# Patient Record
Sex: Female | Born: 1965 | Race: White | Hispanic: No | Marital: Single | Smoking: Never smoker
Health system: Southern US, Community
[De-identification: ages and names within clinical notes are randomized; demographics above are authoritative.]

## PROBLEM LIST (undated history)

## (undated) DIAGNOSIS — I10 Essential (primary) hypertension: Secondary | ICD-10-CM

## (undated) DIAGNOSIS — Q613 Polycystic kidney, unspecified: Secondary | ICD-10-CM

## (undated) HISTORY — DX: Polycystic kidney, unspecified: Q61.3

## (undated) HISTORY — PX: OTHER SURGICAL HISTORY: SHX169

## (undated) HISTORY — PX: HERNIA REPAIR: SHX51

## (undated) HISTORY — DX: Essential (primary) hypertension: I10

## (undated) HISTORY — PX: TONSILLECTOMY AND ADENOIDECTOMY: SUR1326

---

## 2019-09-14 DIAGNOSIS — I150 Renovascular hypertension: Secondary | ICD-10-CM | POA: Insufficient documentation

## 2019-09-14 DIAGNOSIS — Q612 Polycystic kidney, adult type: Secondary | ICD-10-CM | POA: Insufficient documentation

## 2020-01-27 DIAGNOSIS — I151 Hypertension secondary to other renal disorders: Secondary | ICD-10-CM | POA: Diagnosis not present

## 2020-01-27 DIAGNOSIS — Q612 Polycystic kidney, adult type: Secondary | ICD-10-CM | POA: Diagnosis not present

## 2020-02-22 DIAGNOSIS — N1832 Chronic kidney disease, stage 3b: Secondary | ICD-10-CM | POA: Diagnosis not present

## 2020-02-22 DIAGNOSIS — R82998 Other abnormal findings in urine: Secondary | ICD-10-CM | POA: Diagnosis not present

## 2020-02-22 DIAGNOSIS — E785 Hyperlipidemia, unspecified: Secondary | ICD-10-CM | POA: Diagnosis not present

## 2020-08-03 DIAGNOSIS — E785 Hyperlipidemia, unspecified: Secondary | ICD-10-CM | POA: Diagnosis not present

## 2020-08-03 DIAGNOSIS — I151 Hypertension secondary to other renal disorders: Secondary | ICD-10-CM | POA: Diagnosis not present

## 2020-10-19 ENCOUNTER — Other Ambulatory Visit: Payer: Self-pay

## 2020-10-19 ENCOUNTER — Other Ambulatory Visit (HOSPITAL_COMMUNITY)
Admission: RE | Admit: 2020-10-19 | Discharge: 2020-10-19 | Disposition: A | Discharge status: Home / Self Care | Payer: BC Managed Care – PPO | Source: Ambulatory Visit | Attending: Obstetrics and Gynecology | Admitting: Obstetrics and Gynecology

## 2020-10-19 ENCOUNTER — Encounter: Payer: Self-pay | Admitting: Obstetrics and Gynecology

## 2020-10-19 ENCOUNTER — Ambulatory Visit (INDEPENDENT_AMBULATORY_CARE_PROVIDER_SITE_OTHER): Payer: BC Managed Care – PPO | Admitting: Obstetrics and Gynecology

## 2020-10-19 VITALS — BP 134/72 | HR 66 | Ht 63.75 in | Wt 139.0 lb

## 2020-10-19 DIAGNOSIS — Z01419 Encounter for gynecological examination (general) (routine) without abnormal findings: Secondary | ICD-10-CM

## 2020-10-19 DIAGNOSIS — E785 Hyperlipidemia, unspecified: Secondary | ICD-10-CM | POA: Insufficient documentation

## 2020-10-19 DIAGNOSIS — N1832 Chronic kidney disease, stage 3b: Secondary | ICD-10-CM | POA: Insufficient documentation

## 2020-10-19 DIAGNOSIS — Z124 Encounter for screening for malignant neoplasm of cervix: Secondary | ICD-10-CM

## 2020-10-19 NOTE — Progress Notes (Signed)
55 y.o. [id number]. Single White or Caucasian Not Hispanic or Latino female here for annual exam and establishing care after moving from [place] last year in Aug. No vaginal bleeding. No significant vasomotor symptoms.  She has mild vaginal dryness with intercourse.   She has kidney disease. She is going to establish care with a Nephrologist here. Has an Internist here. She has lab every 3 month.     No LMP recorded. Patient is postmenopausal.          Sexually active: Yes.    The current method of family planning is post menopausal status.    Exercising: Yes.     Personal trainer  Smoker:  no  Health Maintenance: Pap:  2021 in [place] History of abnormal Pap:  years ago colpo  MMG:  before Oct 23 2019 in [place]  BMD:   none  Colonoscopy: 2017 f/u 10 years  TDaP:  2022 Gardasil: na    reports that she has never smoked. She has never used smokeless tobacco. She reports current alcohol use. She reports that she does not use drugs. 2-4 drinks a week. Moved here from [place] for work. Moved her with her long term boyfriend. Son is 33, in [place]. Daughter is 25 in [place].   Past Medical History:  Diagnosis Date   Hypertension    Polycystic kidney disease     Past Surgical History:  Procedure Laterality Date   CESAREAN SECTION     HERNIA REPAIR N/A    knee surgery     TONSILLECTOMY AND ADENOIDECTOMY    C/S then VBAC  Current Outpatient Medications  Medication Sig Dispense Refill   lisinopril (ZESTRIL) 40 MG tablet 1 tablet     rosuvastatin (CRESTOR) 5 MG tablet 1 tablet     Tolvaptan (JYNARQUE) 90 & 30 MG TBPK See admin instructions.     No current facility-administered medications for this visit.    History reviewed. No pertinent family history.  Review of Systems  Genitourinary:        Vaginal dryness    All other systems reviewed and are negative.  Exam:   BP 134/72   Pulse 66   Ht 5' 3.75" (1.619 m)   Wt 139 lb (63 kg)   SpO2 99%   BMI  24.05 kg/m   Weight change: @WEIGHTCHANGE @ Height:   Height: 5' 3.75" (161.9 cm)  Ht Readings from Last 3 Encounters:  10/19/20 5' 3.75" (1.619 m)    General appearance: alert, cooperative and appears stated age Head: Normocephalic, without obvious abnormality, atraumatic Neck: no adenopathy, supple, symmetrical, trachea midline and thyroid normal to inspection and palpation Lungs: clear to auscultation bilaterally Cardiovascular: regular rate and rhythm Breasts: normal appearance, no masses or tenderness Abdomen: soft, non-tender; non distended,  both kidneys palpated in her lower abdomen, L>R (not a change per patient) Extremities: extremities normal, atraumatic, no cyanosis or edema Skin: Skin color, texture, turgor normal. No rashes or lesions Lymph nodes: Cervical, supraclavicular, and axillary nodes normal. No abnormal inguinal nodes palpated Neurologic: Grossly normal   Pelvic: External genitalia:  no lesions              Urethra:  normal appearing urethra with no masses, tenderness or lesions              Bartholins and Skenes: normal                 Vagina: atrophic appearing vagina with normal color and discharge, no lesions  Cervix: no lesions               Bimanual Exam:  Uterus:  normal size, contour, position, consistency, mobility, non-tender              Adnexa: no mass, fullness, tenderness               Rectovaginal: Confirms               Anus:  normal sphincter tone, no lesions  Kennon Portela, CMA chaperoned for the exam.  1. Well woman exam Discussed breast self exam Discussed calcium and vit D intake Mammogram due, # given Labs with primary Colonoscopy is UTD  2. Screening for cervical cancer - Cytology - PAP

## 2020-10-19 NOTE — Patient Instructions (Addendum)
Try uberlube for lubrication  EXERCISE   We recommended that you start or continue a regular exercise program for good health. Physical activity is anything that gets your body moving, some is better than none. The CDC recommends 150 minutes per week of Moderate-Intensity Aerobic Activity and 2 or more days of Muscle Strengthening Activity.  Benefits of exercise are limitless: helps weight loss/weight maintenance, improves mood and energy, helps with depression and anxiety, improves sleep, tones and strengthens muscles, improves balance, improves bone density, protects from chronic conditions such as heart disease, high blood pressure and diabetes and so much more. To learn more visit: https://www.cdc.gov/physicalactivity/index.html  DIET: Good nutrition starts with a healthy diet of fruits, vegetables, whole grains, and lean protein sources. Drink plenty of water for hydration. Minimize empty calories, sodium, sweets. For more information about dietary recommendations visit: https://health.gov/our-work/nutrition-physical-activity/dietary-guidelines and https://www.myplate.gov/  ALCOHOL:  Women should limit their alcohol intake to no more than 7 drinks/beers/glasses of wine (combined, not each!) per week. Moderation of alcohol intake to this level decreases your risk of breast cancer and liver damage.  If you are concerned that you may have a problem, or your friends have told you they are concerned about your drinking, there are many resources to help. A well-known program that is free, effective, and available to all people all over the nation is Alcoholics Anonymous.  Check out this site to learn more: https://www.aa.org/   CALCIUM AND VITAMIN D:  Adequate intake of calcium and Vitamin D are recommended for bone health.  You should be getting between 1000-1200 mg of calcium and 800 units of Vitamin D daily between diet and supplements  PAP SMEARS:  Pap smears, to check for cervical cancer or  precancers,  have traditionally been done yearly, scientific advances have shown that most women can have pap smears less often.  However, every woman still should have a physical exam from her gynecologist every year. It will include a breast check, inspection of the vulva and vagina to check for abnormal growths or skin changes, a visual exam of the cervix, and then an exam to evaluate the size and shape of the uterus and ovaries. We will also provide age appropriate advice regarding health maintenance, like when you should have certain vaccines, screening for sexually transmitted diseases, bone density testing, colonoscopy, mammograms, etc.   MAMMOGRAMS:  All women over 40 years old should have a routine mammogram.   COLON CANCER SCREENING: Now recommend starting at age 45. At this time colonoscopy is not covered for routine screening until 50. There are take home tests that can be done between 45-49.   COLONOSCOPY:  Colonoscopy to screen for colon cancer is recommended for all women at age 50.  We know, you hate the idea of the prep.  We agree, BUT, having colon cancer and not knowing it is worse!!  Colon cancer so often starts as a polyp that can be seen and removed at colonscopy, which can quite literally save your life!  And if your first colonoscopy is normal and you have no family history of colon cancer, most women don't have to have it again for 10 years.  Once every ten years, you can do something that may end up saving your life, right?  We will be happy to help you get it scheduled when you are ready.  Be sure to check your insurance coverage so you understand how much it will cost.  It may be covered as a preventative service at no   cost, but you should check your particular policy.      Breast Self-Awareness Breast self-awareness means being familiar with how your breasts look and feel. It involves checking your breasts regularly and reporting any changes to your health care  provider. Practicing breast self-awareness is important. A change in your breasts can be a sign of a serious medical problem. Being familiar with how your breasts look and feel allows you to find any problems early, when treatment is more likely to be successful. All women should practice breast self-awareness, including women who have had breast implants. How to do a breast self-exam One way to learn what is normal for your breasts and whether your breasts are changing is to do a breast self-exam. To do a breast self-exam: Look for Changes  Remove all the clothing above your waist. Stand in front of a mirror in a room with good lighting. Put your hands on your hips. Push your hands firmly downward. Compare your breasts in the mirror. Look for differences between them (asymmetry), such as: Differences in shape. Differences in size. Puckers, dips, and bumps in one breast and not the other. Look at each breast for changes in your skin, such as: Redness. Scaly areas. Look for changes in your nipples, such as: Discharge. Bleeding. Dimpling. Redness. A change in position. Feel for Changes Carefully feel your breasts for lumps and changes. It is best to do this while lying on your back on the floor and again while sitting or standing in the shower or tub with soapy water on your skin. Feel each breast in the following way: Place the arm on the side of the breast you are examining above your head. Feel your breast with the other hand. Start in the nipple area and make  inch (2 cm) overlapping circles to feel your breast. Use the pads of your three middle fingers to do this. Apply light pressure, then medium pressure, then firm pressure. The light pressure will allow you to feel the tissue closest to the skin. The medium pressure will allow you to feel the tissue that is a little deeper. The firm pressure will allow you to feel the tissue close to the ribs. Continue the overlapping circles,  moving downward over the breast until you feel your ribs below your breast. Move one finger-width toward the center of the body. Continue to use the  inch (2 cm) overlapping circles to feel your breast as you move slowly up toward your collarbone. Continue the up and down exam using all three pressures until you reach your armpit.  Write Down What You Find  Write down what is normal for each breast and any changes that you find. Keep a written record with breast changes or normal findings for each breast. By writing this information down, you do not need to depend only on memory for size, tenderness, or location. Write down where you are in your menstrual cycle, if you are still menstruating. If you are having trouble noticing differences in your breasts, do not get discouraged. With time you will become more familiar with the variations in your breasts and more comfortable with the exam. How often should I examine my breasts? Examine your breasts every month. If you are breastfeeding, the best time to examine your breasts is after a feeding or after using a breast pump. If you menstruate, the best time to examine your breasts is 5-7 days after your period is over. During your period, your breasts are lumpier,   and it may be more difficult to notice changes. When should I see my health care provider? See your health care provider if you notice: A change in shape or size of your breasts or nipples. A change in the skin of your breast or nipples, such as a reddened or scaly area. Unusual discharge from your nipples. A lump or thick area that was not there before. Pain in your breasts. Anything that concerns you.  

## 2020-10-23 LAB — CYTOLOGY - PAP
Comment: NEGATIVE
Diagnosis: NEGATIVE
High risk HPV: NEGATIVE

## 2020-12-21 DIAGNOSIS — E785 Hyperlipidemia, unspecified: Secondary | ICD-10-CM | POA: Diagnosis not present

## 2020-12-21 DIAGNOSIS — I129 Hypertensive chronic kidney disease with stage 1 through stage 4 chronic kidney disease, or unspecified chronic kidney disease: Secondary | ICD-10-CM | POA: Diagnosis not present

## 2020-12-21 DIAGNOSIS — N1832 Chronic kidney disease, stage 3b: Secondary | ICD-10-CM | POA: Diagnosis not present

## 2020-12-27 ENCOUNTER — Other Ambulatory Visit (HOSPITAL_BASED_OUTPATIENT_CLINIC_OR_DEPARTMENT_OTHER): Payer: Self-pay

## 2020-12-27 MED ORDER — INFLUENZA VAC SPLIT QUAD 0.5 ML IM SUSY
PREFILLED_SYRINGE | INTRAMUSCULAR | 0 refills | Status: AC
  Filled 2020-12-27: qty 0.5, 1d supply, fill #0

## 2021-01-29 DIAGNOSIS — D485 Neoplasm of uncertain behavior of skin: Secondary | ICD-10-CM | POA: Diagnosis not present

## 2021-01-29 DIAGNOSIS — L570 Actinic keratosis: Secondary | ICD-10-CM | POA: Diagnosis not present

## 2021-01-29 DIAGNOSIS — L821 Other seborrheic keratosis: Secondary | ICD-10-CM | POA: Diagnosis not present

## 2021-01-29 DIAGNOSIS — D225 Melanocytic nevi of trunk: Secondary | ICD-10-CM | POA: Diagnosis not present

## 2021-01-29 DIAGNOSIS — L578 Other skin changes due to chronic exposure to nonionizing radiation: Secondary | ICD-10-CM | POA: Diagnosis not present

## 2021-02-14 DIAGNOSIS — H524 Presbyopia: Secondary | ICD-10-CM | POA: Diagnosis not present

## 2021-02-14 DIAGNOSIS — H04123 Dry eye syndrome of bilateral lacrimal glands: Secondary | ICD-10-CM | POA: Diagnosis not present

## 2021-02-20 ENCOUNTER — Other Ambulatory Visit: Payer: Self-pay | Admitting: Obstetrics and Gynecology

## 2021-02-20 DIAGNOSIS — Z1231 Encounter for screening mammogram for malignant neoplasm of breast: Secondary | ICD-10-CM

## 2021-02-28 DIAGNOSIS — N1832 Chronic kidney disease, stage 3b: Secondary | ICD-10-CM | POA: Diagnosis not present

## 2021-02-28 DIAGNOSIS — N189 Chronic kidney disease, unspecified: Secondary | ICD-10-CM | POA: Diagnosis not present

## 2021-03-21 ENCOUNTER — Ambulatory Visit
Admission: RE | Admit: 2021-03-21 | Discharge: 2021-03-21 | Disposition: A | Discharge status: Home / Self Care | Payer: BC Managed Care – PPO | Source: Ambulatory Visit | Attending: Obstetrics and Gynecology | Admitting: Obstetrics and Gynecology

## 2021-03-21 DIAGNOSIS — Z1231 Encounter for screening mammogram for malignant neoplasm of breast: Secondary | ICD-10-CM | POA: Diagnosis not present

## 2021-04-03 DIAGNOSIS — L988 Other specified disorders of the skin and subcutaneous tissue: Secondary | ICD-10-CM | POA: Diagnosis not present

## 2021-04-03 DIAGNOSIS — D485 Neoplasm of uncertain behavior of skin: Secondary | ICD-10-CM | POA: Diagnosis not present

## 2021-04-03 DIAGNOSIS — L578 Other skin changes due to chronic exposure to nonionizing radiation: Secondary | ICD-10-CM | POA: Diagnosis not present

## 2021-04-03 DIAGNOSIS — D225 Melanocytic nevi of trunk: Secondary | ICD-10-CM | POA: Diagnosis not present

## 2021-05-21 ENCOUNTER — Telehealth (INDEPENDENT_AMBULATORY_CARE_PROVIDER_SITE_OTHER): Payer: BC Managed Care – PPO | Admitting: Family Medicine

## 2021-05-21 NOTE — Telephone Encounter (Signed)
Entered in error

## 2021-05-23 DIAGNOSIS — N189 Chronic kidney disease, unspecified: Secondary | ICD-10-CM | POA: Diagnosis not present

## 2021-05-23 DIAGNOSIS — N1832 Chronic kidney disease, stage 3b: Secondary | ICD-10-CM | POA: Diagnosis not present

## 2021-05-23 DIAGNOSIS — Q612 Polycystic kidney, adult type: Secondary | ICD-10-CM | POA: Diagnosis not present

## 2021-06-17 NOTE — Progress Notes (Signed)
?[facility]ri[facility] Files D.O. ? Sports Medicine ?[address] [place] [address] ?Phone: [phone] ?Subjective:   ?I, Wilford Grist, am serving as a scribe for Dr. Antoine Primas. ? ?This visit occurred during the SARS-CoV-2 public health emergency.  Safety protocols were in place, including screening questions prior to the visit, additional usage of staff PPE, and extensive cleaning of exam room while observing appropriate contact time as indicated for disinfecting solutions.  ? ? ?I'm seeing this patient by the request  of:  Melida Quitter, MD ? ?CC: right knee pain  ? ?[id number]  ?Kelsey Christensen is a 56 y.o. female coming in with complaint of R knee pain. 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 Has tried visco and last injection was about 2 years ago. 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  ? ?Patient also notes pain over ischial tuberosity's bilaterally with squats.  ? ? ? ?  ? ?Past Medical History:  ?Diagnosis Date  ? Hypertension   ? Polycystic kidney disease   ? ?Past Surgical History:  ?Procedure Laterality Date  ? CESAREAN SECTION    ? HERNIA REPAIR N/A   ? knee surgery    ? TONSILLECTOMY AND ADENOIDECTOMY    ? ?Social History  ? ?Socioeconomic History  ? Marital status: Single  ?  Spouse name: Not on file  ? Number of children: Not on file  ? Years of education: Not on file  ? Highest education level: Not on file  ?Occupational History  ? Not on file  ?Tobacco Use  ? Smoking status: Never  ? Smokeless tobacco: Never  ?Vaping Use  ? Vaping Use: Never used  ?Substance and Sexual Activity  ? Alcohol use: Yes  ?  Comment: Occ  ? Drug use: Never  ? Sexual activity: Yes  ?  Birth control/protection: Post-menopausal  ?Other Topics Concern  ? Not on file  ?Social History Narrative  ? Not on file  ? ?Social Determinants of Health  ? ?Financial Resource Strain: Not on file  ?Food Insecurity: Not on file  ?Transportation Needs: Not  on file  ?Physical Activity: Not on file  ?Stress: Not on file  ?Social Connections: Not on file  ? ?No Known Allergies ?Family History  ?Problem Relation Age of Onset  ? Breast cancer Neg Hx   ? ? ?Current Outpatient Medications (Endocrine & Metabolic):  ?  Tolvaptan (JYNARQUE) 90 & 30 MG TBPK, See admin instructions. ? ?Current Outpatient Medications (Cardiovascular):  ?  lisinopril (ZESTRIL) 40 MG tablet, 1 tablet ?  rosuvastatin (CRESTOR) 5 MG tablet, 1 tablet ? ? ? ? ?Current Outpatient Medications (Other):  ?  influenza vac split quadrivalent PF (FLUARIX) 0.5 ML injection, Inject into the muscle. ? ? ?Reviewed prior external information including notes and imaging from  ?primary care provider ?As well as notes that were available from care everywhere and other healthcare systems. ? ?Past medical history, social, surgical and family history all reviewed in electronic medical record.  No pertanent information unless stated regarding to the chief complaint.  ? ?Review of Systems: ? No headache, visual changes, nausea, vomiting, diarrhea, constipation, dizziness, abdominal pain, skin rash, fevers, chills, night sweats, weight loss, swollen lymph nodes, body aches, joint swelling, chest pain, shortness of breath, mood changes. POSITIVE muscle aches ? ?Objective  ?Blood pressure 118/78, pulse 61, height 5' 3.75" (1.619 m), weight 142 lb (64.4 kg), SpO2 98 %. ?  ?  General: No apparent distress alert and oriented x3 mood and affect normal, dressed appropriately.  ?HEENT: Pupils equal, extraocular movements intact  ?Respiratory: Patient's speak in full sentences and does not appear short of breath  ?Cardiovascular: No lower extremity edema, non tender, no erythema  ?Gait mild antalgic ?MSK: Right knee exam shows the patient does have a trace effusion noted.  Lateral tracking of the patella noted.  Mild instability noted with valgus and varus force.  Patient has full range of motion noted.  Mild tenderness more over the  medial joint line ? ?Limited muscular skeletal ultrasound was performed and interpreted by Antoine Primas, M  ?Limited musculoskeletal ultrasound shows that patient does have more of a synovitis with enlargement of the synovium aspect in the patellofemoral joint.  Patient also has a mild to moderate effusion noted of the patellofemoral joint.  Patient does have postsurgical changes noted of the medial meniscus. 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 ?After informed written and verbal consent, patient was seated on exam table. Right knee was prepped with alcohol swab and utilizing anterolateral approach, patient's right knee space was injected with a 21-gauge 2 inch needle injected with 1 cc of 0.5% Marcaine and then injected with 4 cc of 60 mg per mL duralane patient tolerated the procedure well without immediate complications. ? ?  ?Impression and Recommendations:  ?  ? ?The above documentation has been reviewed and is accurate and complete Judi Saa, DO ? ? ? ?

## 2021-06-18 ENCOUNTER — Ambulatory Visit (INDEPENDENT_AMBULATORY_CARE_PROVIDER_SITE_OTHER): Payer: BC Managed Care – PPO | Admitting: Family Medicine

## 2021-06-18 ENCOUNTER — Encounter: Payer: Self-pay | Admitting: Family Medicine

## 2021-06-18 ENCOUNTER — Ambulatory Visit: Payer: Self-pay

## 2021-06-18 ENCOUNTER — Ambulatory Visit (INDEPENDENT_AMBULATORY_CARE_PROVIDER_SITE_OTHER): Payer: BC Managed Care – PPO

## 2021-06-18 VITALS — BP 118/78 | HR 61 | Ht 63.75 in | Wt 142.0 lb

## 2021-06-18 DIAGNOSIS — M1711 Unilateral primary osteoarthritis, right knee: Secondary | ICD-10-CM | POA: Diagnosis not present

## 2021-06-18 DIAGNOSIS — M25562 Pain in left knee: Secondary | ICD-10-CM

## 2021-06-18 DIAGNOSIS — M25551 Pain in right hip: Secondary | ICD-10-CM | POA: Diagnosis not present

## 2021-06-18 DIAGNOSIS — M25552 Pain in left hip: Secondary | ICD-10-CM

## 2021-06-18 DIAGNOSIS — G8929 Other chronic pain: Secondary | ICD-10-CM

## 2021-06-18 DIAGNOSIS — M25561 Pain in right knee: Secondary | ICD-10-CM

## 2021-06-18 DIAGNOSIS — M2550 Pain in unspecified joint: Secondary | ICD-10-CM

## 2021-06-18 DIAGNOSIS — R102 Pelvic and perineal pain: Secondary | ICD-10-CM | POA: Diagnosis not present

## 2021-06-18 NOTE — Assessment & Plan Note (Signed)
Patient has had some difficulty with this previously and does have postsurgical changes.  Patient did have aspiration of the knee and will send to rule out such things as possible gout that could be contributing.  Patient does have some chronic kidney disease and we discussed topical anti-inflammatories sparingly, icing regimen, to provide brace given with patient having more of patellofemoral arthritis.  Patient will continue to stay active.  Discussed monitoring some of the high impact exercises.  Follow-up with me again in 6 to 8 weeks. ?

## 2021-06-18 NOTE — Patient Instructions (Addendum)
Xray today ?[place] injected in R knee today ?Exercises 3x a week ?Ice after activity ?Tru-pull Lite brace ?See me again in 8 weeks ? ?

## 2021-06-19 LAB — SYNOVIAL FLUID ANALYSIS, COMPLETE
Basophils, %: 0 %
Eosinophils-Synovial: 0 % (ref 0–2)
Lymphocytes-Synovial Fld: 70 % (ref 0–74)
Monocyte/Macrophage: 30 % (ref 0–69)
Neutrophil, Synovial: 0 % (ref 0–24)
Synoviocytes, %: 0 % (ref 0–15)
WBC, Synovial: 86 cells/uL (ref <150)

## 2021-06-21 DIAGNOSIS — N1832 Chronic kidney disease, stage 3b: Secondary | ICD-10-CM | POA: Diagnosis not present

## 2021-07-12 ENCOUNTER — Other Ambulatory Visit: Payer: Self-pay | Admitting: Nephrology

## 2021-07-12 DIAGNOSIS — I129 Hypertensive chronic kidney disease with stage 1 through stage 4 chronic kidney disease, or unspecified chronic kidney disease: Secondary | ICD-10-CM | POA: Diagnosis not present

## 2021-07-12 DIAGNOSIS — N1832 Chronic kidney disease, stage 3b: Secondary | ICD-10-CM

## 2021-07-12 DIAGNOSIS — E875 Hyperkalemia: Secondary | ICD-10-CM | POA: Diagnosis not present

## 2021-07-15 ENCOUNTER — Other Ambulatory Visit: Payer: Self-pay | Admitting: Nephrology

## 2021-07-15 DIAGNOSIS — N1832 Chronic kidney disease, stage 3b: Secondary | ICD-10-CM

## 2021-07-15 DIAGNOSIS — E875 Hyperkalemia: Secondary | ICD-10-CM

## 2021-07-15 DIAGNOSIS — I129 Hypertensive chronic kidney disease with stage 1 through stage 4 chronic kidney disease, or unspecified chronic kidney disease: Secondary | ICD-10-CM

## 2021-07-23 ENCOUNTER — Ambulatory Visit
Admission: RE | Admit: 2021-07-23 | Discharge: 2021-07-23 | Disposition: A | Discharge status: Home / Self Care | Payer: BC Managed Care – PPO | Source: Ambulatory Visit | Attending: Nephrology | Admitting: Nephrology

## 2021-07-23 DIAGNOSIS — E875 Hyperkalemia: Secondary | ICD-10-CM

## 2021-07-23 DIAGNOSIS — I129 Hypertensive chronic kidney disease with stage 1 through stage 4 chronic kidney disease, or unspecified chronic kidney disease: Secondary | ICD-10-CM

## 2021-07-23 DIAGNOSIS — Q613 Polycystic kidney, unspecified: Secondary | ICD-10-CM | POA: Diagnosis not present

## 2021-07-23 DIAGNOSIS — N1832 Chronic kidney disease, stage 3b: Secondary | ICD-10-CM

## 2021-07-23 DIAGNOSIS — N189 Chronic kidney disease, unspecified: Secondary | ICD-10-CM | POA: Diagnosis not present

## 2021-08-08 NOTE — Progress Notes (Signed)
[facility] [address] [place] [address] Phone: [phone] Subjective:   Kelsey Christensen, am serving as a scribe for Dr. Antoine Primas.  I'm seeing this patient by the request  of:  Melida Quitter, MD  CC: Knee pain, bilateral hip pain  [id number]  06/18/2021 Patient has had some difficulty with this previously and does have postsurgical changes.  Patient did have aspiration of the knee and will send to rule out such things as possible gout that could be contributing.  Patient does have some chronic kidney disease and we discussed topical anti-inflammatories sparingly, icing regimen, to provide brace given with patient having more of patellofemoral arthritis.  Patient will continue to stay active.  Discussed monitoring some of the high impact exercises.  Follow-up with me again in 6 to 8 weeks.  Update 08/12/2021 Kelsey Christensen is a 56 y.o. female coming in with complaint of R knee and B hip pain. Patient states doing okay. Better than before. Decrease in frequency and intensity of pain. Hips are doing fine. No new complaints.      Past Medical History:  Diagnosis Date   Hypertension    Polycystic kidney disease    Past Surgical History:  Procedure Laterality Date   CESAREAN SECTION     HERNIA REPAIR N/A    knee surgery     TONSILLECTOMY AND ADENOIDECTOMY     Social History   Socioeconomic History   Marital status: Single    Spouse name: Not on file   Number of children: Not on file   Years of education: Not on file   Highest education level: Not on file  Occupational History   Not on file  Tobacco Use   Smoking status: Never   Smokeless tobacco: Never  Vaping Use   Vaping Use: Never used  Substance and Sexual Activity   Alcohol use: Yes    Comment: Occ   Drug use: Never   Sexual activity: Yes    Birth control/protection: Post-menopausal  Other Topics Concern   Not on file  Social History Narrative   Not on file    Social Determinants of Health   Financial Resource Strain: Not on file  Food Insecurity: Not on file  Transportation Needs: Not on file  Physical Activity: Not on file  Stress: Not on file  Social Connections: Not on file   No Known Allergies Family History  Problem Relation Age of Onset   Breast cancer Neg Hx     Current Outpatient Medications (Endocrine & Metabolic):    Tolvaptan (JYNARQUE) 90 & 30 MG TBPK, See admin instructions.  Current Outpatient Medications (Cardiovascular):    lisinopril (ZESTRIL) 40 MG tablet, 1 tablet   rosuvastatin (CRESTOR) 5 MG tablet, 1 tablet     Current Outpatient Medications (Other):    influenza vac split quadrivalent PF (FLUARIX) 0.5 ML injection, Inject into the muscle.   Reviewed prior external information including notes and imaging from  primary care provider As well as notes that were available from care everywhere and other healthcare systems.  Past medical history, social, surgical and family history all reviewed in electronic medical record.  No pertanent information unless stated regarding to the chief complaint.   Review of Systems:  No headache, visual changes, nausea, vomiting, diarrhea, constipation, dizziness, abdominal pain, skin rash, fevers, chills, night sweats, weight loss, swollen lymph nodes, body aches, joint swelling, chest pain, shortness of breath, mood changes. POSITIVE muscle aches  Objective  Blood pressure 120/78, height 5\' 3"  (1.6 m), weight 145 lb (65.8 kg), SpO2 95 %.   General: No apparent distress alert and oriented x3 mood and affect normal, dressed appropriately.  HEENT: Pupils equal, extraocular movements intact  Respiratory: Patient's speak in full sentences and does not appear short of breath  Cardiovascular: No lower extremity edema, non tender, no erythema  Gait normal with good balance and coordination.  MSK: Right knee exam does have some lateral tracking of the patella still noted.   Patient does have some mild atrophy of the VMO compared to the contralateral side.  Mild crepitus of the patella.  Bilateral hamstrings to have significant tightness noted.  tenderness of the ischial area.  Does feel like there is a possible bursal sac at both areas.  No significant low back pain    Impression and Recommendations:     The above documentation has been reviewed and is accurate and complete , DO

## 2021-08-12 ENCOUNTER — Ambulatory Visit (INDEPENDENT_AMBULATORY_CARE_PROVIDER_SITE_OTHER): Payer: BC Managed Care – PPO | Admitting: Family Medicine

## 2021-08-12 DIAGNOSIS — M7070 Other bursitis of hip, unspecified hip: Secondary | ICD-10-CM | POA: Diagnosis not present

## 2021-08-12 DIAGNOSIS — M1711 Unilateral primary osteoarthritis, right knee: Secondary | ICD-10-CM | POA: Diagnosis not present

## 2021-08-12 NOTE — Assessment & Plan Note (Signed)
Bilaterally right greater than left.  Discussed thigh compression, heel lifts, home exercises.  Patient does teach multiple exercise courses.  We will see how patient responds.

## 2021-08-12 NOTE — Assessment & Plan Note (Signed)
Patient is doing very well at this time.  Do not think that we need to consider any other significant treatment option.  The patient will be potentially moving and we will get her set up with a another sports medicine provider if needed.

## 2021-08-12 NOTE — Patient Instructions (Signed)
Heel lifts Thigh compression with workouts Knee looks great Work on [employer] Read about PRP See me again in maybe 6-8 weeks

## 2021-09-05 DIAGNOSIS — Q612 Polycystic kidney, adult type: Secondary | ICD-10-CM | POA: Diagnosis not present

## 2021-09-05 DIAGNOSIS — N1832 Chronic kidney disease, stage 3b: Secondary | ICD-10-CM | POA: Diagnosis not present

## 2021-09-05 DIAGNOSIS — N189 Chronic kidney disease, unspecified: Secondary | ICD-10-CM | POA: Diagnosis not present

## 2021-09-24 ENCOUNTER — Ambulatory Visit: Payer: BC Managed Care – PPO | Admitting: Family Medicine

## 2021-10-22 ENCOUNTER — Ambulatory Visit: Payer: BC Managed Care – PPO | Admitting: Family Medicine

## 2021-10-22 NOTE — Progress Notes (Signed)
56 y.o. [id number] Single White or Caucasian Not Hispanic or Latino female here for annual exam.  No vaginal bleeding. No dyspareunia. No bowel or bladder c/o.    Moving to [place] next week.   No LMP recorded. Patient is postmenopausal.          Sexually active: Yes.    The current method of family planning is post menopausal status.    Exercising: Yes.     Teaches work out classes Smoker:  no  Health Maintenance: Pap:  10/19/20 WNL Hr HPV Neg  History of abnormal Pap:  yes had a colpo years ago.  MMG:  03/21/21 density B Bi-rads 1 Neg  BMD:   never  Colonoscopy: 2017 f/u 10 years  TDaP:  2022 Gardasil: n/a   reports that she has never smoked. She has never used smokeless tobacco. She reports current alcohol use. She reports that she does not use drugs. Social ETOH. Daughter lives in [place], son lives in [place].   Past Medical History:  Diagnosis Date   Hypertension    Polycystic kidney disease     Past Surgical History:  Procedure Laterality Date   CESAREAN SECTION     HERNIA REPAIR N/A    knee surgery     TONSILLECTOMY AND ADENOIDECTOMY      Current Outpatient Medications  Medication Sig Dispense Refill   influenza vac split quadrivalent PF (FLUARIX) 0.5 ML injection Inject into the muscle. 0.5 mL 0   lisinopril (ZESTRIL) 40 MG tablet 1 tablet     rosuvastatin (CRESTOR) 5 MG tablet 1 tablet     Tolvaptan (JYNARQUE) 90 & 30 MG TBPK See admin instructions.     No current facility-administered medications for this visit.    Family History  Problem Relation Age of Onset   Breast cancer Neg Hx     Review of Systems  All other systems reviewed and are negative.   Exam:   BP (!) 140/70   Pulse 67   Ht 5\' 5"  (1.651 m)   Wt 141 lb (64 kg)   SpO2 99%   BMI 23.46 kg/m   Weight change: @WEIGHTCHANGE @ Height:   Height: 5\' 5"  (165.1 cm)  Ht Readings from Last 3 Encounters:  10/30/21 5\' 5"  (1.651 m)  08/12/21 5\' 3"  (1.6 m)  06/18/21 5' 3.75" (1.619 m)     General appearance: alert, cooperative and appears stated age Head: Normocephalic, without obvious abnormality, atraumatic Neck: no adenopathy, supple, symmetrical, trachea midline and thyroid normal to inspection and palpation Lungs: clear to auscultation bilaterally Cardiovascular: regular rate and rhythm Breasts: normal appearance, no masses or tenderness Abdomen: soft, non-tender; non distended,  left kidney is palpated in her mid abdomen  Extremities: extremities normal, atraumatic, no cyanosis or edema Skin: Skin color, texture, turgor normal. No rashes or lesions Lymph nodes: Cervical, supraclavicular, and axillary nodes normal. No abnormal inguinal nodes palpated Neurologic: Grossly normal   Pelvic: External genitalia:  no lesions              Urethra:  normal appearing urethra with no masses, tenderness or lesions              Bartholins and Skenes: normal                 Vagina: atrophic appearing vagina with normal color and discharge, no lesions              Cervix: no lesions  Bimanual Exam:  Uterus:  normal size, contour, position, consistency, mobility, non-tender              Adnexa: no mass, fullness, tenderness               Rectovaginal: Confirms               Anus:  normal sphincter tone, no lesions  Debby Freiberg, CMA chaperoned for the exam.  1. Well woman exam Discussed breast self exam Discussed calcium and vit D intake Mammogram, colonoscopy and pap are UTD Labs with primary

## 2021-10-25 DIAGNOSIS — N1832 Chronic kidney disease, stage 3b: Secondary | ICD-10-CM | POA: Diagnosis not present

## 2021-10-29 DIAGNOSIS — N184 Chronic kidney disease, stage 4 (severe): Secondary | ICD-10-CM | POA: Diagnosis not present

## 2021-10-29 DIAGNOSIS — E875 Hyperkalemia: Secondary | ICD-10-CM | POA: Diagnosis not present

## 2021-10-29 DIAGNOSIS — Q612 Polycystic kidney, adult type: Secondary | ICD-10-CM | POA: Diagnosis not present

## 2021-10-29 DIAGNOSIS — I129 Hypertensive chronic kidney disease with stage 1 through stage 4 chronic kidney disease, or unspecified chronic kidney disease: Secondary | ICD-10-CM | POA: Diagnosis not present

## 2021-10-30 ENCOUNTER — Ambulatory Visit (INDEPENDENT_AMBULATORY_CARE_PROVIDER_SITE_OTHER): Payer: BC Managed Care – PPO | Admitting: Obstetrics and Gynecology

## 2021-10-30 ENCOUNTER — Encounter: Payer: Self-pay | Admitting: Obstetrics and Gynecology

## 2021-10-30 VITALS — BP 128/64 | HR 67 | Ht 65.0 in | Wt 141.0 lb

## 2021-10-30 DIAGNOSIS — Z01419 Encounter for gynecological examination (general) (routine) without abnormal findings: Secondary | ICD-10-CM

## 2021-10-30 NOTE — Progress Notes (Unsigned)
[facility] [address] [place] [address] Phone: [phone] Subjective:   Bruce Donath, am serving as a scribe for Dr. Antoine Primas.  I'm seeing this patient by the request  of:  Melida Quitter, MD  CC: Ischial pain  [id number]  08/12/2021 Bilaterally right greater than left.  Discussed thigh compression, heel lifts, home exercises.  Patient does teach multiple exercise courses.  We will see how patient responds.  Patient is doing very well at this time.  Do not think that we need to consider any other significant treatment option.  The patient will be potentially moving and we will get her set up with a another sports medicine provider if needed.  Update 10/31/2021 Kelsey Christensen is a 56 y.o. female coming in with complaint of B hip, R>L and R knee pain. Patient states that she has pain in L hip over ischial tuberosity. Pain seems to be getting worse.        Past Medical History:  Diagnosis Date   Hypertension    Polycystic kidney disease    Past Surgical History:  Procedure Laterality Date   CESAREAN SECTION     HERNIA REPAIR N/A    knee surgery     TONSILLECTOMY AND ADENOIDECTOMY     Social History   Socioeconomic History   Marital status: Single    Spouse name: Not on file   Number of children: Not on file   Years of education: Not on file   Highest education level: Not on file  Occupational History   Not on file  Tobacco Use   Smoking status: Never   Smokeless tobacco: Never  Vaping Use   Vaping Use: Never used  Substance and Sexual Activity   Alcohol use: Yes    Comment: Occ   Drug use: Never   Sexual activity: Yes    Birth control/protection: Post-menopausal  Other Topics Concern   Not on file  Social History Narrative   Not on file   Social Determinants of Health   Financial Resource Strain: Not on file  Food Insecurity: Not on file  Transportation Needs: Not on file  Physical Activity: Not on  file  Stress: Not on file  Social Connections: Not on file   No Known Allergies Family History  Problem Relation Age of Onset   Breast cancer Neg Hx     Current Outpatient Medications (Endocrine & Metabolic):    Tolvaptan (JYNARQUE) 90 & 30 MG TBPK, See admin instructions.  Current Outpatient Medications (Cardiovascular):    lisinopril (ZESTRIL) 40 MG tablet, 1 tablet   rosuvastatin (CRESTOR) 5 MG tablet, 1 tablet     Current Outpatient Medications (Other):    influenza vac split quadrivalent PF (FLUARIX) 0.5 ML injection, Inject into the muscle.   Objective  Blood pressure 122/72, temperature (!) 82 F (27.8 C), height 5\' 5"  (1.651 m), SpO2 98 %.   General: No apparent distress alert and oriented x3 mood and affect normal, dressed appropriately.   Procedure: Real-time Ultrasound Guided Injection of left ischial bursa Device: GE Logiq Q7 Ultrasound guided injection is preferred based studies that show increased duration, increased effect, greater accuracy, decreased procedural pain, increased response rate, and decreased cost with ultrasound guided versus blind injection.  Verbal informed consent obtained.  Time-out conducted.  Noted no overlying erythema, induration, or other signs of local infection.  Skin prepped in a sterile fashion.  Local anesthesia: Topical Ethyl chloride.  With sterile technique  and under real time ultrasound guidance: With a 21-gauge 2 inch needle injected into the left ischial area in the tendon sheath of the hamstring with 0.5 cc of 0.5% Marcaine and then injected 3 cc minutes PRP. Completed without difficulty  Pain immediately resolved suggesting accurate placement of the medication.  Advised to call if fevers/chills, erythema, induration, drainage, or persistent bleeding.  Impression: Technically successful ultrasound guided injection.  Procedure: Real-time Ultrasound Guided Injection of right ischial bursa Device: GE Logiq Q7 Ultrasound  guided injection is preferred based studies that show increased duration, increased effect, greater accuracy, decreased procedural pain, increased response rate, and decreased cost with ultrasound guided versus blind injection.  Verbal informed consent obtained.  Time-out conducted.  Noted no overlying erythema, induration, or other signs of local infection.  Skin prepped in a sterile fashion.  Local anesthesia: Topical Ethyl chloride.  With sterile technique and under real time ultrasound guidance: With a 21-gauge 2 inch needle injected with 0.5 cc of 0.5% Marcaine and then 2 cc of PRP. Completed without difficulty  Advised to call if fevers/chills, erythema, induration, drainage, or persistent bleeding.  Impression: Technically successful ultrasound guided injection.      Impression and Recommendations:    The above documentation has been reviewed and is accurate and complete Judi Saa, DO

## 2021-10-30 NOTE — Patient Instructions (Signed)

## 2021-10-31 ENCOUNTER — Ambulatory Visit: Payer: Self-pay

## 2021-10-31 ENCOUNTER — Ambulatory Visit (INDEPENDENT_AMBULATORY_CARE_PROVIDER_SITE_OTHER): Payer: Self-pay | Admitting: Family Medicine

## 2021-10-31 ENCOUNTER — Encounter: Payer: Self-pay | Admitting: Family Medicine

## 2021-10-31 VITALS — BP 122/72 | Temp 82.0°F | Ht 65.0 in

## 2021-10-31 DIAGNOSIS — M7070 Other bursitis of hip, unspecified hip: Secondary | ICD-10-CM

## 2021-10-31 DIAGNOSIS — M25551 Pain in right hip: Secondary | ICD-10-CM

## 2021-10-31 NOTE — Patient Instructions (Signed)
No ice or IBU for 3 days ?Heat and Tylenol are ok ?See me again in 5-6 weeks ?

## 2021-10-31 NOTE — Assessment & Plan Note (Signed)
Bilateral done.  Left greater than right.  We discussed with patient about post PRP instructions.  Patient is moving and given names of other providers to would do the same procedure there potentially and can call us for any type of question she has otherwise.

## 2021-11-28 ENCOUNTER — Encounter: Payer: Self-pay | Admitting: Family Medicine

## 2021-12-10 DIAGNOSIS — Q612 Polycystic kidney, adult type: Secondary | ICD-10-CM | POA: Diagnosis not present

## 2022-02-13 ENCOUNTER — Encounter: Payer: Self-pay | Admitting: Family Medicine

## 2022-07-15 ENCOUNTER — Encounter: Payer: Self-pay | Admitting: Family Medicine

## 2022-09-05 ENCOUNTER — Ambulatory Visit: Payer: BC Managed Care – PPO | Admitting: Family Medicine

## 2023-02-17 IMAGING — MG MM DIGITAL SCREENING BILAT W/ TOMO AND CAD
6 of 10 series · 6 of 30 positions shown · non-contrast
Comparison: Previous exam(s).

CLINICAL DATA: Screening.

EXAM:
DIGITAL SCREENING BILATERAL MAMMOGRAM WITH TOMOSYNTHESIS AND CAD
TECHNIQUE: Bilateral screening digital craniocaudal and mediolateral oblique
mammograms were obtained. Bilateral screening digital breast
tomosynthesis was performed. The images were evaluated with
computer-aided detection.

[R MLO synth-2D]
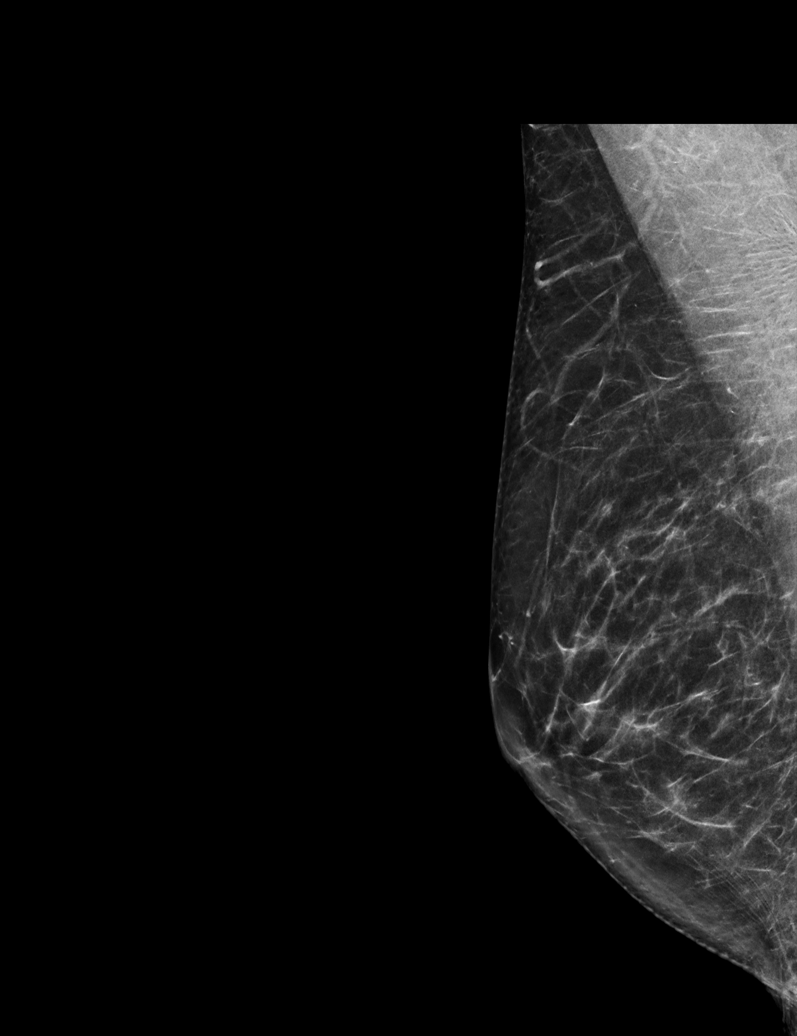

[L MLO synth-2D]
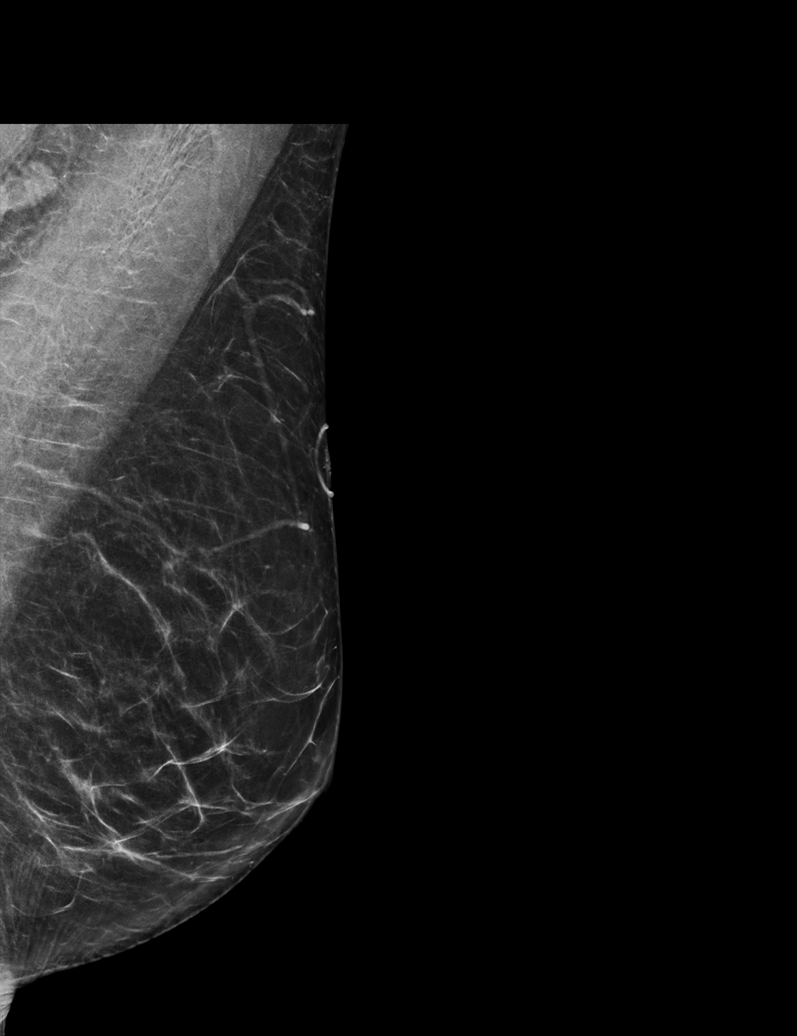

[R CC synth-2D (1 of 2)]
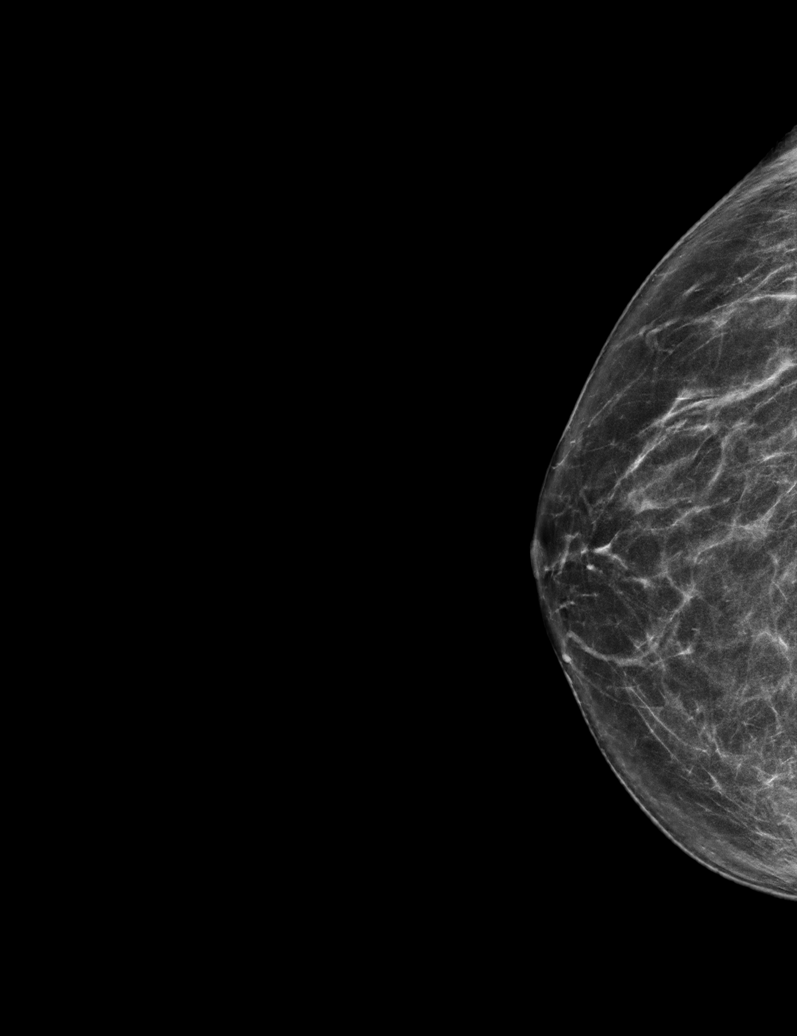

[L CC synth-2D]
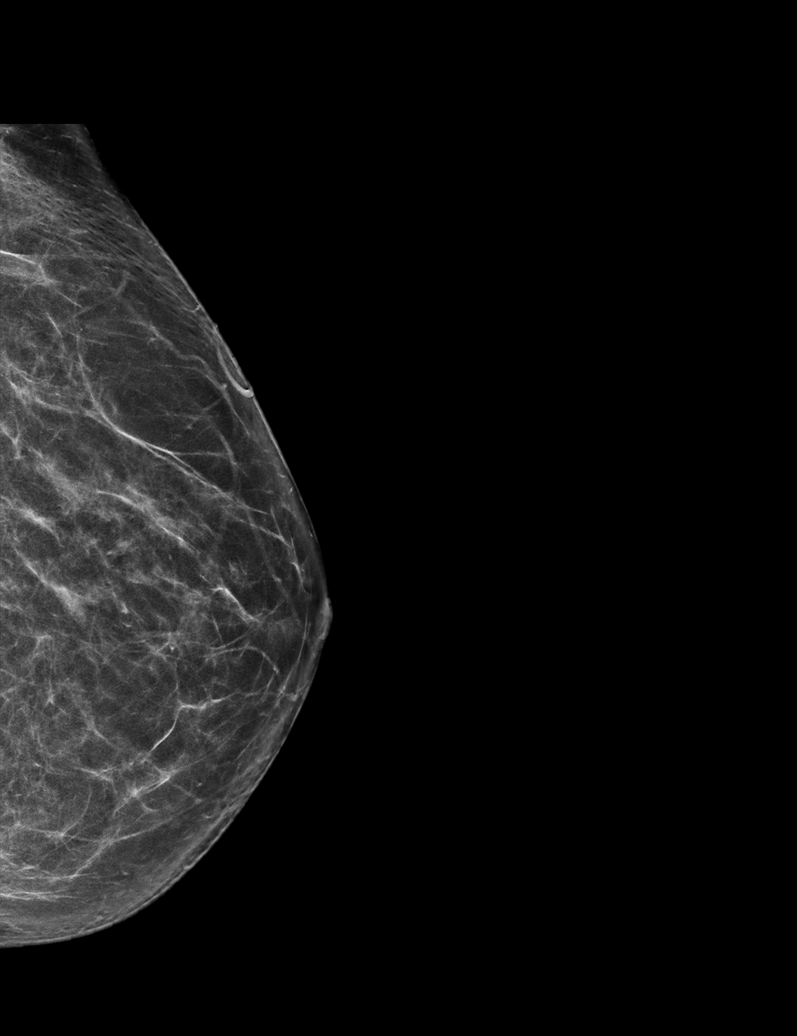

[R CC synth-2D (2 of 2)]
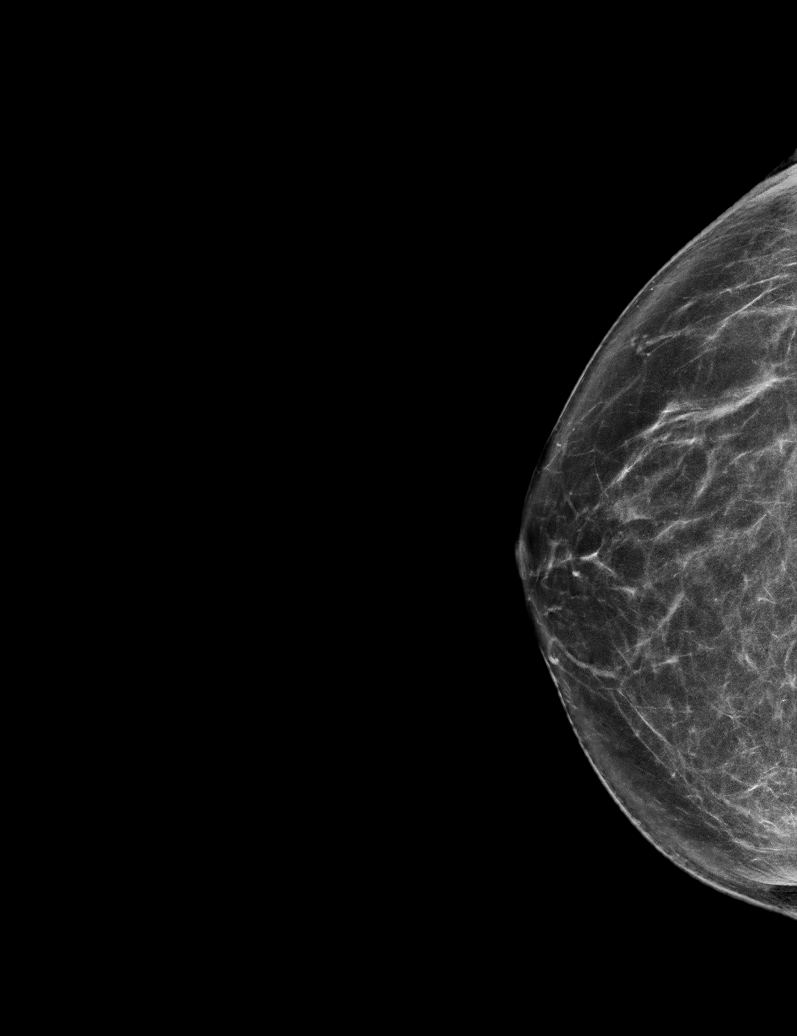

[R CC tomo · tomo slice 33/64.0]
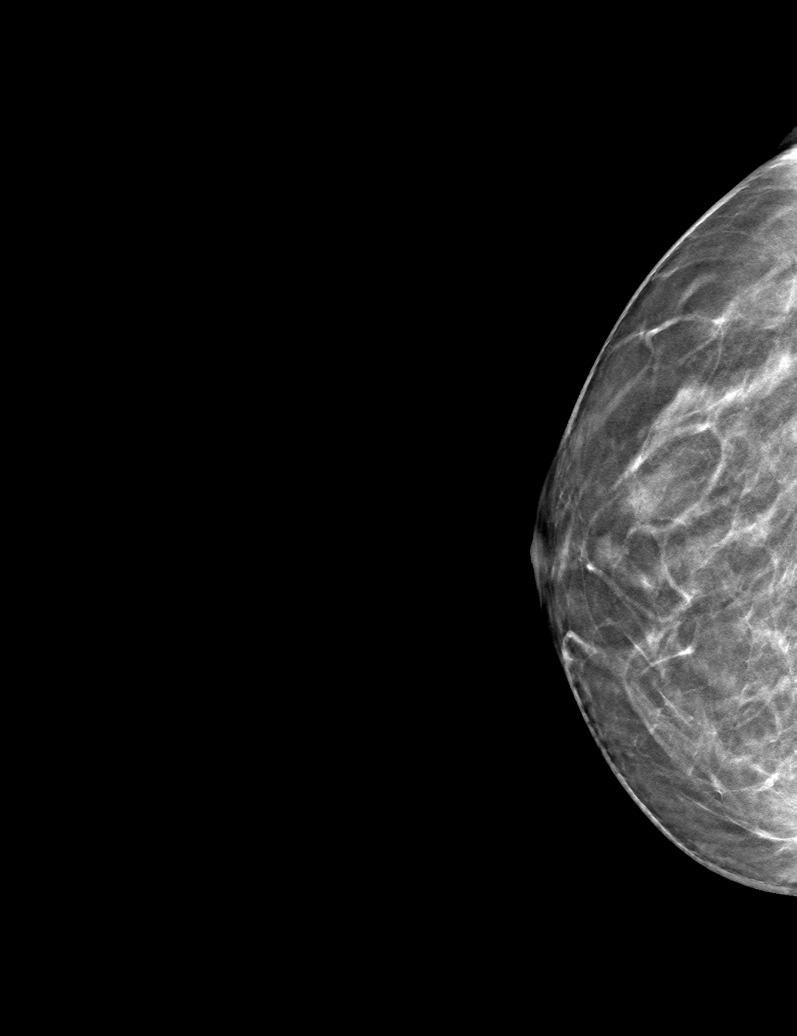

[6 of 30 positions shown; findings below may reference images not displayed]

ACR Breast Density Category b: There are scattered areas of
fibroglandular density.
FINDINGS: There are no findings suspicious for malignancy.
IMPRESSION: No mammographic evidence of malignancy. A result letter of this
screening mammogram will be mailed directly to the patient.

RECOMMENDATION:
Screening mammogram in one year. (Code:51-O-LD2)

BI-RADS CATEGORY  1: Negative.

## 2023-05-17 IMAGING — DX DG KNEE STANDING AP BILAT
1 series · 1 of 1 positions shown · non-contrast
Comparison: None.

CLINICAL DATA: Bilateral knee pain.

EXAM:
BILATERAL KNEES STANDING - 1 VIEW

[knee ap]
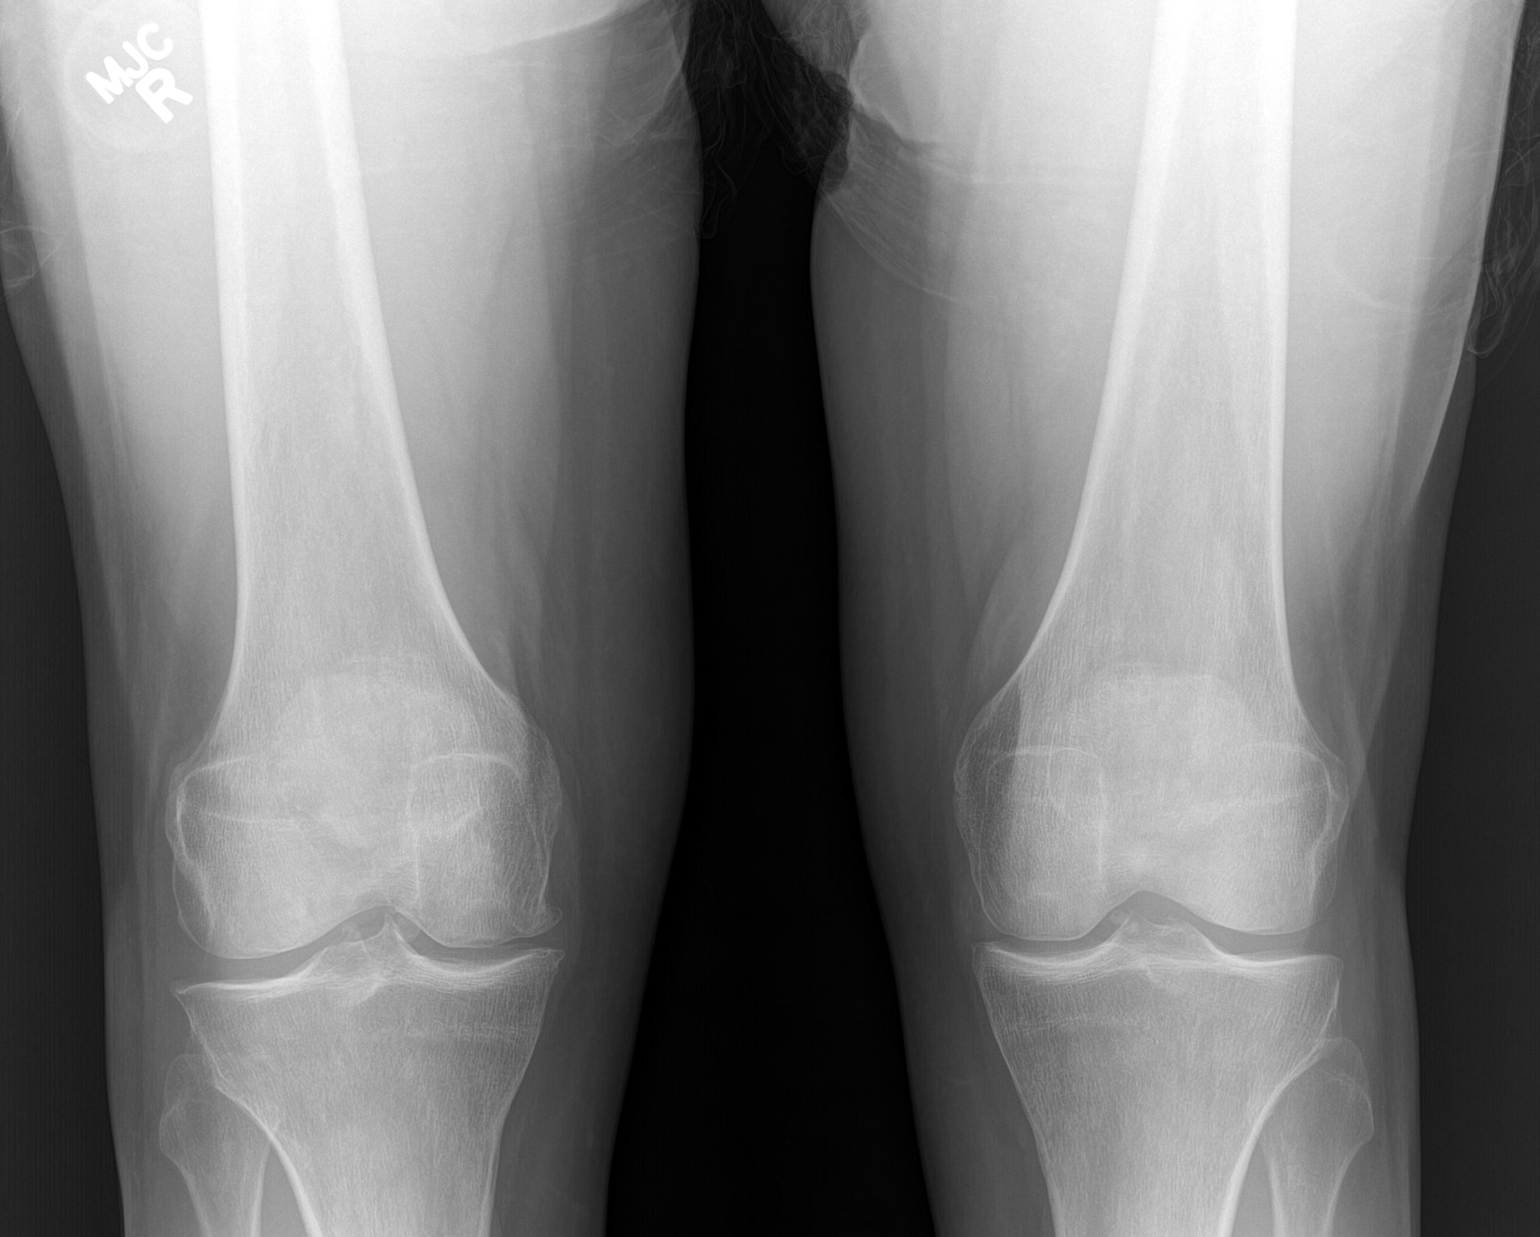

[1 of 1 positions shown; findings below may reference images not displayed]

FINDINGS: No evidence of fracture, dislocation, or joint effusion. Mild medial
and lateral marginal osteophyte formation is seen on the right. Mild
bilateral medial and lateral tibiofemoral compartment space
narrowing is also seen. Soft tissues are unremarkable.
IMPRESSION: Mild bilateral degenerative changes.

## 2023-05-17 IMAGING — DX DG PELVIS 1-2V
1 series · 1 of 1 positions shown · non-contrast
Comparison: None.

CLINICAL DATA: Right knee pain.

EXAM:
PELVIS - 1-2 VIEW

[pelvis ap]
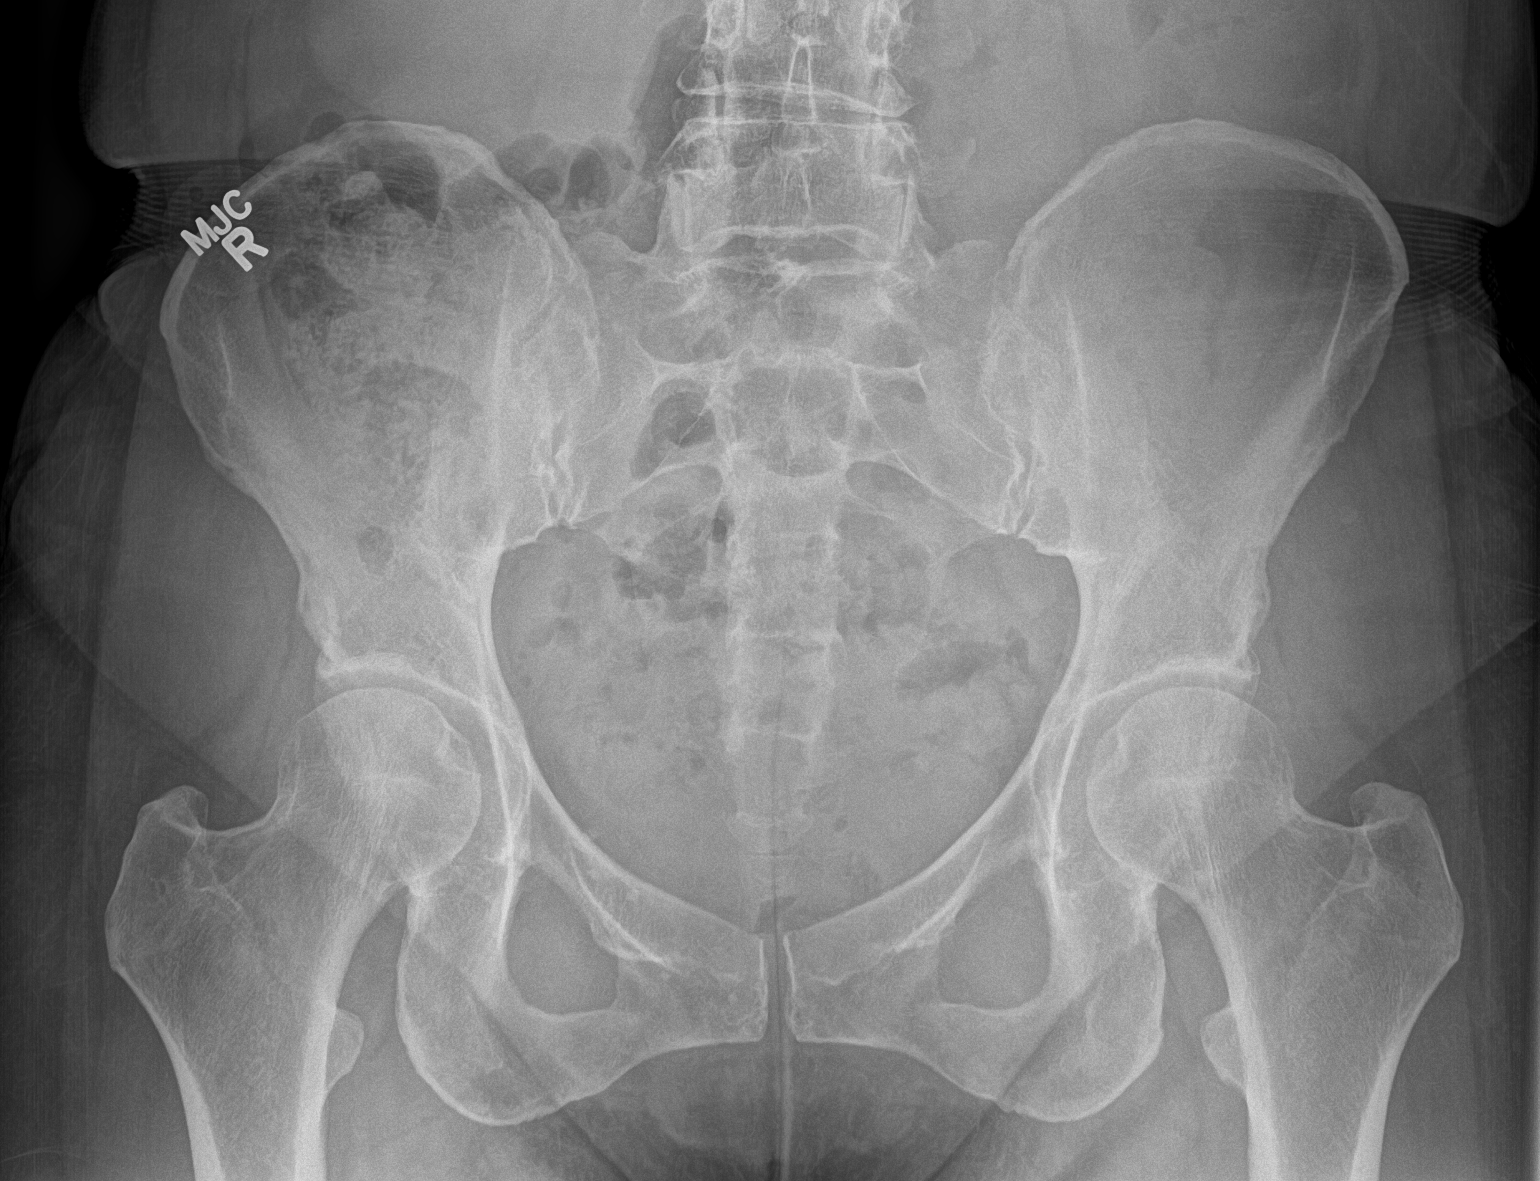

[1 of 1 positions shown; findings below may reference images not displayed]

FINDINGS: There is no evidence of pelvic fracture or diastasis. No pelvic bone
lesions are seen.
IMPRESSION: Negative.
